# Patient Record
Sex: Female | Born: 1995 | Race: White | Hispanic: No | Marital: Single
Health system: Southern US, Community
[De-identification: ages and names within clinical notes are randomized; demographics above are authoritative.]

---
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
## 2014-12-03 ENCOUNTER — Ambulatory Visit (INDEPENDENT_AMBULATORY_CARE_PROVIDER_SITE_OTHER): Payer: Self-pay | Admitting: Internal Medicine

## 2014-12-03 DIAGNOSIS — Z7189 Other specified counseling: Secondary | ICD-10-CM

## 2014-12-03 DIAGNOSIS — Z7184 Encounter for health counseling related to travel: Secondary | ICD-10-CM | POA: Insufficient documentation

## 2014-12-03 NOTE — Progress Notes (Signed)
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     Jody Perkins is a Black 19 year old man with past medical history notable for uncontrolled hypertension, uncontrolled IDDM 1 with diabetic retinopathy/nephropathy, recent episode of hypertensive emergency, demand ischemia, history of medication noncompliance.      His last appointment with me was on 03/03/2022 for a decreased kidney function. Initial work-up was notable for elevated K/L ratio without M-spike. ANCA/C3/C4/PLA2R antibody were unrevealing. A kidney biopsy was offerred but declined by the patient.  A kidney ultrasound reviewed evidence of medical renal disease. Lokelma 5 g and ferrous sulfate were started.  A course of therapeutic vitamin D was started.  Nifedipine dose was lowered.  Currently not on ACE/ARB.    Later he was hospitalized between 12/4-12/7 for SOB iso influenza A requiring BiPAP. Then again between 12/21-12/22 for HTN emergency with a  flash pulmonary edema along with an AKI on CKD.    Review of Systems:   As per HPI. All other systems reviewed are negative.    Home Medication:  Patient's Medications   New Prescriptions    No medications on file   Previous Medications    ASPIRIN 81 MG CHEWABLE TABLET    Take 1 tablet by mouth in the morning.    CARVEDILOL (COREG) 25 MG TABLET    Take 1 tablet by mouth in the morning and 1 tablet in the evening. Take with meals.    CONTINUOUS BLOOD GLUC SENSOR (DEXCOM G6 SENSOR) MISC    Use 1 each As Directed See Admin Instructions Use for 10 days    CONTINUOUS BLOOD GLUC TRANSMIT (DEXCOM G6 TRANSMITTER) MISC    Use 1 each As Directed See Admin Instructions Use for 3 mos    DOXAZOSIN (CARDURA) 1 MG TABLET    Take 1 tablet by mouth at bedtime    ERGOCALCIFEROL (VITAMIN D2) 50000 UNIT CAPSULE    Take 1 capsule by mouth once a week  for 12 doses    FERROUS SULFATE 325 (65 FE) MG EC TABLET    Take 1 tablet by mouth every  other day    HUMALOG 100 UNIT/ML INJECTION    Take up to 100 units daily through insulin pump    INSULIN ASPART (NOVOLOG) 100 UNIT/ML INJECTION    Inject up to 100 units daily via insulin pump    INSULIN DEGLUDEC (TRESIBA FLEXTOUCH) 100 UNIT/ML INJECTION    28 units nightly    INSULIN DISPOSABLE PUMP (OMNIPOD 5 G6 INTRO, GEN 5,) KIT    Use 1 each As Directed every third day    INSULIN DISPOSABLE PUMP (OMNIPOD 5 G6 POD, GEN 5,) MISC    Use 1 each As Directed every third day Change pods every 72 hours    INSULIN PEN NEEDLE (PEN NEEDLES) 32G X 4 MM MISC    Use 1 each As Directed 4 (four) times daily    NIFEDIPINE (ADALAT CC) 90 MG 24 HR TABLET    Take 1 tablet by mouth in the morning.    ROSUVASTATIN (CRESTOR) 10 MG TABLET    Take 1 tablet by mouth in the morning.    SODIUM ZIRCONIUM CYCLOSILICATE (LOKELMA) 5 G PACK    Take 1 packet by mouth in the morning.    TORSEMIDE 40 MG TABS    Take 40 mg by mouth in the morning.   Modified Medications      No medications on file   Discontinued Medications    No medications on file       Physical Exam:   Vital Signs: There were no vitals taken for this visit.    GENERAL: Normal appearance, well-developed  HEENT: Grossly within normal limit  LUNGS: Clear to auscultation  CARDIAC: Regular S1, S2, no gallop or murmur or rub  ABDOMEN: Soft, non tender, BS+, no CVA tenderness, no guarding or rebound tenderness.  EXTREMITIES: No edema  NEUROLOGIC: Grossly intact, no focal neurodeficits    Recent labs: Reviewed  Lab Results   Component Value Date    NA 144 05/18/2022    K 5.0 05/18/2022    CL 112 (H) 05/18/2022    CO2 21 05/18/2022    BUN 63 (H) 05/18/2022    CREAT 4.2 (H) 05/18/2022    GFR 18 (L) 05/18/2022    GLUCOSER 114 05/18/2022    CA 8.4 (L) 05/18/2022    PHOS 4.6 05/18/2022    ALBUMIN 2.3 (L) 05/17/2022    PTHINTACTWC 102 (H) 03/02/2022    VITD25H 19 (L) 05/17/2022       Lab Results   Component Value Date    WBC 6.1 05/18/2022    HGB 9.6 (L) 05/18/2022    HCT 29.5 (L) 05/18/2022     PLTA 308 05/18/2022       Urine Analysis: Reviewed  No results for input(s): "UACOL", "UACLA", "UAGLU", "UABIL", "UAKET", "SPEGRAVURINE", "UAOCC", "UAPH", "UAPRO", "UANIT", "LEUKOCYTES", "ALBCREATR" in the last 72 hours.    Imaging: Reviewed  No imaging results were found within the past 3 days.    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 There is possibly a significant microvascular disease as evidenced by highly fluctuating kidney function, possibly volume related. He also has evidence of diabetic retinopathy/nephropathy. His hemoglobin A1c remains highly elevated.  Currently on insulin.  Initially, I plan to start RAAS blockade/SGLT2 inhibitor which are contraindicated due to hyperkalemia/low GFR, respectively.  However, with his creatinine trending up consistently, lately, a thorough work-up should be pursued. Differential is broad including secondary FSGS/IgAN/paraprotein/ANCA.  - To get a renal ultrasound  - BMP today showed uptrending Cr, 2.9 with mildy elevated K, 5.4  - need a close follow up of his Cr, plan to repeat Cr in 2 weeks  -Urine showed coarse granular casts, no cellular casts, low concern for active GN. Possibly volume related Cr fluctuation, low BP noted today.   -UPCR 1.6g, UACR 0.2g, mainly non-albumin proteinuria, further w/u is warranted  -to check ANA, dsDNA ab, ANCA, SPEP/UPEP/FLC,C3,C4, and PLA2R ab, low threshold to get a kidney biopsy, will discuss with the patient with initial w/u results.     #Hyperkalemia  Possibly due to decreased kidney function/increase intake  - Low potassium diet  - to start Lokelma 5 g daily     #Hypertension, BP is controlled  - Previously on nifedipine 30 mg daily, now 60 mg daily. With elevated Cr and low BP, plan to lower the dose back to 30 mg daily  -the patient was  advised to monitor BP at home closely. He was advised to go back to 60 mg daily if his BP remains above 130/90, consistently.  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

## 2014-12-06 ENCOUNTER — Ambulatory Visit (INDEPENDENT_AMBULATORY_CARE_PROVIDER_SITE_OTHER): Payer: BLUE CROSS/BLUE SHIELD | Admitting: *Deleted

## 2014-12-06 DIAGNOSIS — Z23 Encounter for immunization: Secondary | ICD-10-CM

## 2014-12-06 DIAGNOSIS — Z7189 Other specified counseling: Secondary | ICD-10-CM | POA: Diagnosis not present

## 2014-12-06 DIAGNOSIS — IMO0002 Reserved for concepts with insufficient information to code with codable children: Secondary | ICD-10-CM
